# Patient Record
Sex: Male | Born: 2000 | Race: White | Hispanic: No | Marital: Single | Smoking: Never smoker
Health system: Southern US, Community
[De-identification: ages and names within clinical notes are randomized; demographics above are authoritative.]

## PROBLEM LIST (undated history)

## (undated) HISTORY — PX: NO PAST SURGERIES: SHX2092

---
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
## 2020-03-15 ENCOUNTER — Emergency Department
Admission: EM | Admit: 2020-03-15 | Discharge: 2020-03-15 | Disposition: A | Discharge status: Home / Self Care | Payer: PRIVATE HEALTH INSURANCE | Attending: Emergency Medicine | Admitting: Emergency Medicine

## 2020-03-15 ENCOUNTER — Emergency Department: Payer: PRIVATE HEALTH INSURANCE

## 2020-03-15 ENCOUNTER — Other Ambulatory Visit: Payer: Self-pay

## 2020-03-15 DIAGNOSIS — R109 Unspecified abdominal pain: Secondary | ICD-10-CM | POA: Insufficient documentation

## 2020-03-15 DIAGNOSIS — N200 Calculus of kidney: Secondary | ICD-10-CM | POA: Insufficient documentation

## 2020-03-15 DIAGNOSIS — R1110 Vomiting, unspecified: Secondary | ICD-10-CM | POA: Diagnosis not present

## 2020-03-15 LAB — COMPREHENSIVE METABOLIC PANEL
ALT: 17 U/L (ref 0–44)
AST: 22 U/L (ref 15–41)
Albumin: 4.9 g/dL (ref 3.5–5.0)
Alkaline Phosphatase: 67 U/L (ref 38–126)
Anion gap: 14 (ref 5–15)
BUN: 14 mg/dL (ref 6–20)
CO2: 22 mmol/L (ref 22–32)
Calcium: 9.5 mg/dL (ref 8.9–10.3)
Chloride: 105 mmol/L (ref 98–111)
Creatinine, Ser: 1.07 mg/dL (ref 0.61–1.24)
GFR calc Af Amer: >60 mL/min (ref >60)
GFR calc non Af Amer: >60 mL/min (ref >60)
Glucose, Bld: 128 mg/dL — ABNORMAL HIGH (ref 70–99)
Potassium: 3.5 mmol/L (ref 3.5–5.1)
Sodium: 141 mmol/L (ref 135–145)
Total Bilirubin: 1.2 mg/dL (ref 0.3–1.2)
Total Protein: 7.4 g/dL (ref 6.5–8.1)

## 2020-03-15 LAB — URINALYSIS, COMPLETE (UACMP) WITH MICROSCOPIC
Bacteria, UA: NONE SEEN
Bilirubin Urine: NEGATIVE
Glucose, UA: NEGATIVE mg/dL
Ketones, ur: 20 mg/dL — AB
Leukocytes,Ua: NEGATIVE
Nitrite: NEGATIVE
Protein, ur: 30 mg/dL — AB
Specific Gravity, Urine: 1.025 (ref 1.005–1.030)
Squamous Epithelial / HPF: NONE SEEN (ref 0–5)
pH: 5 (ref 5.0–8.0)

## 2020-03-15 LAB — CBC
HCT: 46 % (ref 39.0–52.0)
Hemoglobin: 16.1 g/dL (ref 13.0–17.0)
MCH: 31.1 pg (ref 26.0–34.0)
MCHC: 35 g/dL (ref 30.0–36.0)
MCV: 88.8 fL (ref 80.0–100.0)
Platelets: 278 10*3/uL (ref 150–400)
RBC: 5.18 MIL/uL (ref 4.22–5.81)
RDW: 12.6 % (ref 11.5–15.5)
WBC: 10.6 10*3/uL — ABNORMAL HIGH (ref 4.0–10.5)
nRBC: 0 % (ref 0.0–0.2)

## 2020-03-15 LAB — LIPASE, BLOOD: Lipase: 21 U/L (ref 11–51)

## 2020-03-15 MED ORDER — CEPHALEXIN 500 MG PO CAPS
500.0000 mg | ORAL_CAPSULE | Freq: Two times a day (BID) | ORAL | 0 refills | Status: AC
Start: 2020-03-15 — End: 2020-03-22

## 2020-03-15 MED ORDER — FENTANYL CITRATE (PF) 100 MCG/2ML IJ SOLN
50.0000 ug | Freq: Once | INTRAMUSCULAR | Status: AC
  Administered 2020-03-15: 50 ug via INTRAVENOUS
  Filled 2020-03-15: qty 2

## 2020-03-15 MED ORDER — CEPHALEXIN 500 MG PO CAPS
500.0000 mg | ORAL_CAPSULE | Freq: Once | ORAL | Status: AC
  Administered 2020-03-15: 500 mg via ORAL
  Filled 2020-03-15: qty 1

## 2020-03-15 MED ORDER — OXYCODONE-ACETAMINOPHEN 5-325 MG PO TABS
1.0000 | ORAL_TABLET | ORAL | Status: AC | PRN
  Administered 2020-03-15 (×2): 1 via ORAL
  Filled 2020-03-15 (×2): qty 1

## 2020-03-15 MED ORDER — ONDANSETRON HCL 4 MG/2ML IJ SOLN
4.0000 mg | Freq: Once | INTRAMUSCULAR | Status: AC
  Administered 2020-03-15: 4 mg via INTRAVENOUS
  Filled 2020-03-15: qty 2

## 2020-03-15 MED ORDER — OXYCODONE-ACETAMINOPHEN 5-325 MG PO TABS: 1.0000 | ORAL_TABLET | ORAL | 0 refills | Status: AC | PRN

## 2020-03-15 MED ORDER — ONDANSETRON 4 MG PO TBDP
4.0000 mg | ORAL_TABLET | Freq: Once | ORAL | Status: AC | PRN
  Administered 2020-03-15: 4 mg via ORAL
  Filled 2020-03-15: qty 1

## 2020-03-15 MED ORDER — ONDANSETRON 4 MG PO TBDP: 4.0000 mg | ORAL_TABLET | Freq: Three times a day (TID) | ORAL | 0 refills | Status: AC | PRN

## 2020-03-15 MED ORDER — KETOROLAC TROMETHAMINE 30 MG/ML IJ SOLN
15.0000 mg | Freq: Once | INTRAMUSCULAR | Status: AC
  Administered 2020-03-15: 15 mg via INTRAVENOUS
  Filled 2020-03-15: qty 1

## 2020-03-15 NOTE — ED Provider Notes (Signed)
[facility] Emergency Department Provider Note   ____________________________________________   First MD Initiated Contact with Patient 03/15/20 1505     (approximate)  I have reviewed the triage vital signs and the nursing notes.   HISTORY  Chief Complaint Flank Pain and Emesis    HPI Zachary Lynn is a 19 y.o. male with no significant past medical history who presents to the ED complaining of flank and abdominal pain.  Patient reports that he woke up this morning with severe pain radiating from his left flank into the left lower quadrant of his abdomen.  Pain has been waxing and waning in severity, but has been present constantly.  He describes it as sharp and not exacerbated or alleviated by anything.  It is been associated with nausea and he vomited once prior to arrival.  He denies any dysuria or hematuria, has not had any fevers.  He has no history of kidney stones or abdominal surgeries.        History reviewed. No pertinent past medical history.  There are no problems to display for this patient.   History reviewed. No pertinent surgical history.  Prior to Admission medications   Medication Sig Start Date End Date Taking? Authorizing Provider  cephALEXin (KEFLEX) 500 MG capsule Take 1 capsule (500 mg total) by mouth 2 (two) times daily for 7 days. 03/15/20 03/22/20  Chesley Noon, MD  ondansetron (ZOFRAN ODT) 4 MG disintegrating tablet Take 1 tablet (4 mg total) by mouth every 8 (eight) hours as needed for nausea or vomiting. 03/15/20   Chesley Noon, MD  oxyCODONE-acetaminophen (PERCOCET) 5-325 MG tablet Take 1 tablet by mouth every 4 (four) hours as needed for severe pain. 03/15/20 03/15/21  Chesley Noon, MD    Allergies Patient has no known allergies.  History reviewed. No pertinent family history.  Social History Social History   Tobacco Use  . Smoking status: Not on file  Substance Use Topics  . Alcohol use: Not on file  .  Drug use: Not on file    Review of Systems  Constitutional: No fever/chills Eyes: No visual changes. ENT: No sore throat. Cardiovascular: Denies chest pain. Respiratory: Denies shortness of breath. Gastrointestinal: Positive for flank and abdominal pain.  Positive for nausea and vomiting.  No diarrhea.  No constipation. Genitourinary: Negative for dysuria. Musculoskeletal: Negative for back pain. Skin: Negative for rash. Neurological: Negative for headaches, focal weakness or numbness.  ____________________________________________   PHYSICAL EXAM:  VITAL SIGNS: ED Triage Vitals [03/15/20 1148]  Enc Vitals Group     BP (!) 142/56     Pulse Rate 90     Resp 18     Temp 98.4 F (36.9 C)     Temp Source Oral     SpO2 99 %     Weight 125 lb (56.7 kg)     Height 5\' 7"  (1.702 m)     Head Circumference      Peak Flow      Pain Score 10     Pain Loc      Pain Edu?      Excl. in GC?     Constitutional: Alert and oriented. Eyes: Conjunctivae are normal. Head: Atraumatic. Nose: No congestion/rhinnorhea. Mouth/Throat: Mucous membranes are moist. Neck: Normal ROM Cardiovascular: Normal rate, regular rhythm. Grossly normal heart sounds. Respiratory: Normal respiratory effort.  No retractions. Lungs CTAB. Gastrointestinal: Soft and mildly tender to palpation in the left lower quadrant with no rebound or guarding.  No  CVA tenderness bilaterally. No distention. Genitourinary: deferred Musculoskeletal: No lower extremity tenderness nor edema. Neurologic:  Normal speech and language. No gross focal neurologic deficits are appreciated. Skin:  Skin is warm, dry and intact. No rash noted. Psychiatric: Mood and affect are normal. Speech and behavior are normal.  ____________________________________________   LABS (all labs ordered are listed, but only abnormal results are displayed)  Labs Reviewed  COMPREHENSIVE METABOLIC PANEL - Abnormal; Notable for the following components:        Result Value   Glucose, Bld 128 (*)    All other components within normal limits  CBC - Abnormal; Notable for the following components:   WBC 10.6 (*)    All other components within normal limits  URINALYSIS, COMPLETE (UACMP) WITH MICROSCOPIC - Abnormal; Notable for the following components:   Color, Urine YELLOW (*)    APPearance CLOUDY (*)    Hgb urine dipstick SMALL (*)    Ketones, ur 20 (*)    Protein, ur 30 (*)    All other components within normal limits  URINE CULTURE  LIPASE, BLOOD    PROCEDURES  Procedure(s) performed (including Critical Care):  Procedures   ____________________________________________   INITIAL IMPRESSION / ASSESSMENT AND PLAN / ED COURSE       19 year old male with no significant past medical history presents to the ED complaining of severe left flank pain radiating to the left lower quadrant of his abdomen starting earlier this morning along with nausea and one episode of vomiting.  He has some mild left lower quadrant tenderness but no CVA tenderness.  CT scan performed from triage demonstrates distal left ureteral stone with obstruction and associated hydronephrosis.  There is questionable infection on UA and we will send urine for culture, treat with Keflex for now.  His symptoms are slightly improved following dose of Percocet but we will also treat with IV Toradol and reassess.  Remainder of lab work is unremarkable and if we are able to control his pain here in the ED he would be appropriate for discharge home with urology follow-up.  Patient reports pain is much improved and he is tolerating p.o. without difficulty.  He is appropriate for discharge home with urology follow-up, was counseled to return to the ED for any new or worsening symptoms.  Patient agrees with plan.      ____________________________________________   FINAL CLINICAL IMPRESSION(S) / ED DIAGNOSES  Final diagnoses:  Kidney stone     ED Discharge Orders          Ordered    oxyCODONE-acetaminophen (PERCOCET) 5-325 MG tablet  Every 4 hours PRN     Discontinue  Reprint     03/15/20 1630    ondansetron (ZOFRAN ODT) 4 MG disintegrating tablet  Every 8 hours PRN     Discontinue  Reprint     03/15/20 1630    cephALEXin (KEFLEX) 500 MG capsule  2 times daily     Discontinue  Reprint     03/15/20 1630           Note:  This document was prepared using Dragon voice recognition software and may include unintentional dictation errors.   Blake Divine, MD 03/15/20 [phone]

## 2020-03-15 NOTE — ED Notes (Signed)
Pt provided urine strainer and specimen cup and instructed on use.

## 2020-03-15 NOTE — ED Triage Notes (Signed)
Pt comes POV with n/v and left lower abdominal and flank pain starting this morning when pt woke up. Pt has not had a stone before. Denies trouble urinating or burning.

## 2020-03-15 NOTE — ED Notes (Signed)
Pt states pain has not decreased by much

## 2020-03-15 NOTE — ED Notes (Signed)
Pt c/o left sided flank pain, states nausea has subsided. Pt AOx4, appears uncomfortable

## 2020-03-17 LAB — URINE CULTURE

## 2021-05-05 ENCOUNTER — Other Ambulatory Visit: Payer: Self-pay | Admitting: Podiatry

## 2021-05-05 DIAGNOSIS — R600 Localized edema: Secondary | ICD-10-CM

## 2021-05-12 ENCOUNTER — Ambulatory Visit
Admission: RE | Admit: 2021-05-12 | Discharge: 2021-05-12 | Disposition: A | Discharge status: Home / Self Care | Payer: PRIVATE HEALTH INSURANCE | Source: Ambulatory Visit | Attending: Podiatry | Admitting: Podiatry

## 2021-05-12 ENCOUNTER — Other Ambulatory Visit: Payer: Self-pay

## 2021-05-12 DIAGNOSIS — R600 Localized edema: Secondary | ICD-10-CM | POA: Diagnosis not present

## 2021-05-12 MED ORDER — GADOBUTROL 1 MMOL/ML IV SOLN
5.0000 mL | Freq: Once | INTRAVENOUS | Status: AC | PRN
  Administered 2021-05-12: 5 mL via INTRAVENOUS

## 2021-06-09 ENCOUNTER — Other Ambulatory Visit: Payer: Self-pay

## 2021-06-09 ENCOUNTER — Ambulatory Visit (INDEPENDENT_AMBULATORY_CARE_PROVIDER_SITE_OTHER): Payer: No Typology Code available for payment source | Admitting: Nurse Practitioner

## 2021-06-09 ENCOUNTER — Encounter (INDEPENDENT_AMBULATORY_CARE_PROVIDER_SITE_OTHER): Payer: Self-pay | Admitting: Nurse Practitioner

## 2021-06-09 ENCOUNTER — Other Ambulatory Visit (INDEPENDENT_AMBULATORY_CARE_PROVIDER_SITE_OTHER): Payer: Self-pay | Admitting: Nurse Practitioner

## 2021-06-09 ENCOUNTER — Ambulatory Visit (INDEPENDENT_AMBULATORY_CARE_PROVIDER_SITE_OTHER): Payer: No Typology Code available for payment source

## 2021-06-09 VITALS — BP 128/68 | HR 64 | Resp 16 | Ht 70.0 in | Wt 147.0 lb

## 2021-06-09 DIAGNOSIS — M7989 Other specified soft tissue disorders: Secondary | ICD-10-CM

## 2021-06-09 DIAGNOSIS — R2242 Localized swelling, mass and lump, left lower limb: Secondary | ICD-10-CM | POA: Diagnosis not present

## 2021-06-09 NOTE — Progress Notes (Signed)
Subjective:    Patient ID: Zachary Lynn, male    DOB: 19-May-2001, 20 y.o.   MRN: [id number] Chief Complaint  Patient presents with  . New Patient (Initial Visit)    Ref Ether Griffins left foot swelling    Zachary Lynn is a 20 year old male that presents today for evaluation of left foot swelling by his podiatrist Dr. Ether Griffins.  This is been ongoing for about 8 months and it happened out of the blue.  He notes that it is not painful although sometimes he gets some soreness in his thigh.  The swelling never goes further than his ankle area.  He does note that he wears compression socks daily although he is not sure of the tightness daily and he also elevates his lower extremity.  He also describes himself as being fairly active.  The patient recently had an MRI which showed soft tissue edema.  The patient denies any claudication-like symptoms, rest pain or ulceration.  Today noninvasive studies show no evidence of DVT or superficial thrombophlebitis in the left lower extremity.  No evidence of deep venous insufficiency in the left lower extremity as well as no evidence of superficial venous reflux in the great or short saphenous vein.   Review of Systems  Cardiovascular:        Foot swelling  All other systems reviewed and are negative.     Objective:   Physical Exam Vitals reviewed.  HENT:     Head: Normocephalic.  Cardiovascular:     Rate and Rhythm: Normal rate.     Pulses: Normal pulses.          Dorsalis pedis pulses are 2+ on the left side.       Posterior tibial pulses are 2+ on the left side.  Pulmonary:     Effort: Pulmonary effort is normal.  Skin:    General: Skin is warm and dry.     Capillary Refill: Capillary refill takes less than 2 seconds.  Neurological:     Mental Status: He is alert and oriented to person, place, and time.  Psychiatric:        Mood and Affect: Mood normal.        Behavior: Behavior normal.        Thought Content: Thought content normal.         Judgment: Judgment normal.    BP 128/68 (BP Location: Right Arm)   Pulse 64   Resp 16   Ht 5\' 10"  (1.778 m)   Wt 147 lb (66.7 kg)   BMI 21.09 kg/m   History reviewed. No pertinent past medical history.  Social History   Socioeconomic History  . Marital status: Single    Spouse name: Not on file  . Number of children: Not on file  . Years of education: Not on file  . Highest education level: Not on file  Occupational History  . Not on file  Tobacco Use  . Smoking status: Never  . Smokeless tobacco: Never  Substance and Sexual Activity  . Alcohol use: Never  . Drug use: Never  . Sexual activity: Not on file  Other Topics Concern  . Not on file  Social History Narrative  . Not on file   Social Determinants of Health   Financial Resource Strain: Not on file  Food Insecurity: Not on file  Transportation Needs: Not on file  Physical Activity: Not on file  Stress: Not on file  Social Connections: Not on file  Intimate Partner Violence: Not on file    Past Surgical History:  Procedure Laterality Date  . NO PAST SURGERIES      Family History  Problem Relation Age of Onset  . Stroke Father   . Heart disease Maternal Grandfather   . Heart attack Maternal Grandfather     No Known Allergies  CBC Latest Ref Rng & Units 03/15/2020  WBC 4.0 - 10.5 K/uL 10.6(H)  Hemoglobin 13.0 - 17.0 g/dL 64.3  Hematocrit 32.9 - 52.0 % 46.0  Platelets 150 - 400 K/uL 278      CMP     Component Value Date/Time   NA 141 03/15/2020 1154   K 3.5 03/15/2020 1154   CL 105 03/15/2020 1154   CO2 22 03/15/2020 1154   GLUCOSE 128 (H) 03/15/2020 1154   BUN 14 03/15/2020 1154   CREATININE 1.07 03/15/2020 1154   CALCIUM 9.5 03/15/2020 1154   PROT 7.4 03/15/2020 1154   ALBUMIN 4.9 03/15/2020 1154   AST 22 03/15/2020 1154   ALT 17 03/15/2020 1154   ALKPHOS 67 03/15/2020 1154   BILITOT 1.2 03/15/2020 1154   GFRNONAA >60 03/15/2020 1154   GFRAA >60 03/15/2020 1154     No  results found.     Assessment & Plan:   1. Localized swelling of left foot Today noninvasive studies show no evidence of vascular abnormalities.  The patient has no evidence of venous insufficiency and lymphedema is unlikely as it typically is not present in just the foot area.  The patient is advised to continue with use of his medical grade compression stockings 20 to 30 mmHg.  He is also advised to elevate his lower extremities when he is not active.  Otherwise the patient will continue to follow-up with his podiatrist as well as PCP for further work-up of possible cause of the foot swelling.   Current Outpatient Medications on File Prior to Visit  Medication Sig Dispense Refill  . ondansetron (ZOFRAN ODT) 4 MG disintegrating tablet Take 1 tablet (4 mg total) by mouth every 8 (eight) hours as needed for nausea or vomiting. (Patient not taking: Reported on 06/09/2021) 12 tablet 0   No current facility-administered medications on file prior to visit.    There are no Patient Instructions on file for this visit. No follow-ups on file.   Georgiana Spinner, NP

## 2022-08-02 IMAGING — MR MR FOOT*L* WO/W CM
12 series · 40 of 40 positions shown · IV contrast (gadavist)
Comparison: Radiograph report from 04/28/2021. Images not
retrievable at the time of this dictation.

CLINICAL DATA: Left foot swelling for 6-7 months

EXAM:
MRI OF THE LEFT FOREFOOT WITHOUT AND WITH CONTRAST
TECHNIQUE: Multiplanar, multisequence MR imaging of the left foot was performed
both before and after administration of intravenous contrast.
CONTRAST:  5mL GADAVIST GADOBUTROL 1 MMOL/ML IV SOLN

[Series 4: T1 · coronal · 3.0mm · 0.66mm/px · 4 of 45 slices shown (1 of 3)]
[im 1/45]
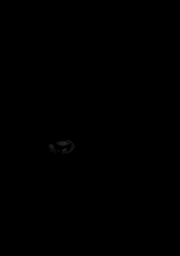
[im 15/45]
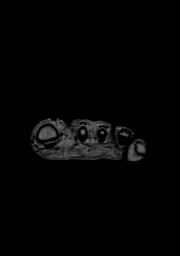
[im 30/45]
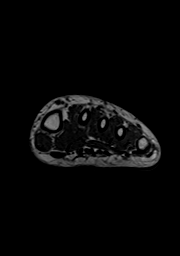
[im 45/45]
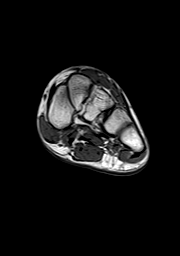

[Series 5: T1 fat-sat · coronal · 3.0mm · 0.66mm/px · 4 of 45 slices shown (1 of 2)]
[im 1/45]
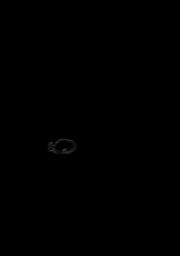
[im 15/45]
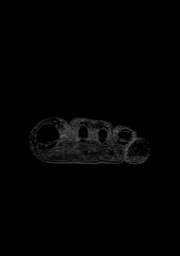
[im 30/45]
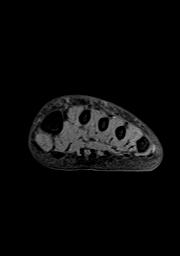
[im 45/45]
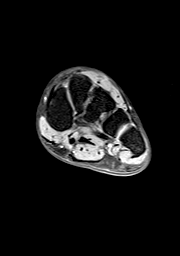

[Series 6: T2 fat-sat · coronal · 3.0mm · 0.66mm/px · 5 of 48 slices shown (1 of 3)]
[im 1/48]
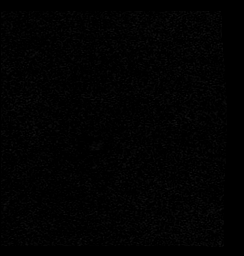
[im 12/48]
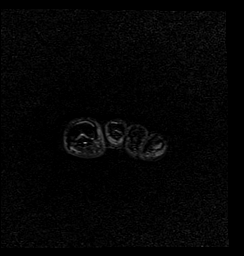
[im 24/48]
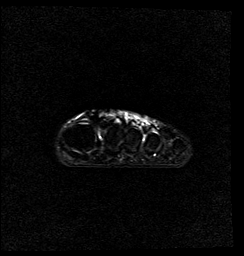
[im 36/48]
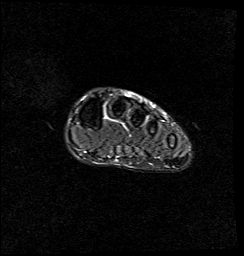
[im 48/48]
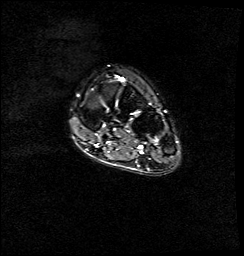

[Series 7: T1 · axial · 3.0mm · 0.66mm/px · 1 of 13 slices shown (2 of 3)]
[im 1/13]
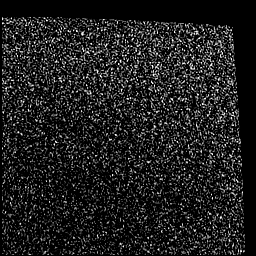

[Series 8: T1 · axial · 3.0mm · 0.66mm/px · z∈[-116,-36]mm · 3 of 25 slices shown (3 of 3)]
[im 1/25]
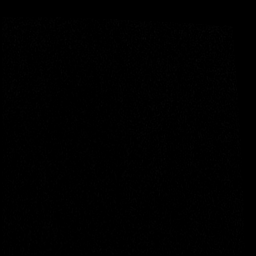
[im 13/25]
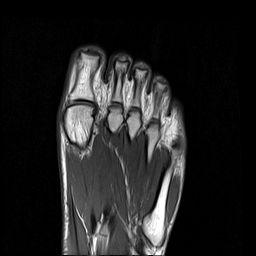
[im 25/25]
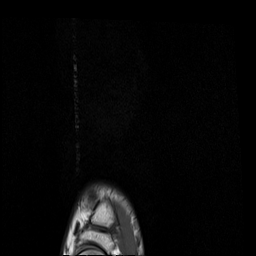

[Series 9: T2 fat-sat · axial · 3.0mm · 0.66mm/px · z∈[-121,-32]mm · 3 of 28 slices shown (2 of 3)]
[im 1/28]
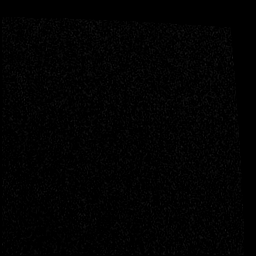
[im 14/28]
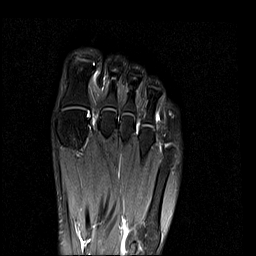
[im 28/28]
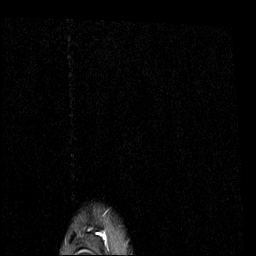

[Series 10: STIR · sagittal · 3.0mm · 0.70mm/px · 3 of 30 slices shown (1 of 2)]
[im 1/30]
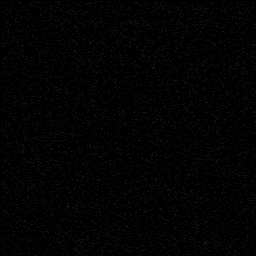
[im 15/30]
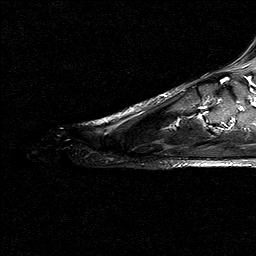
[im 30/30]
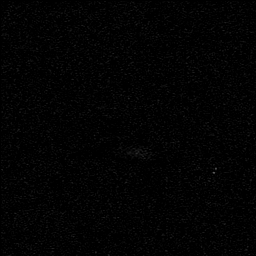

[Series 11: STIR · sagittal · 3.0mm · 0.59mm/px · 3 of 30 slices shown (2 of 2)]
[im 1/30]
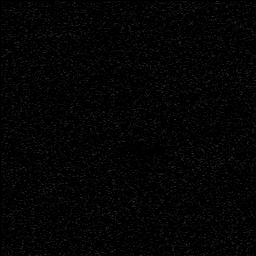
[im 15/30]
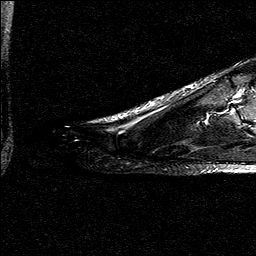
[im 30/30]
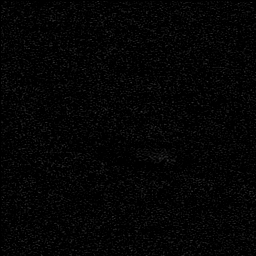

[Series 12: T1 fat-sat · coronal · 3.0mm · 0.66mm/px · 5 of 45 slices shown (2 of 2)]
[im 1/45]
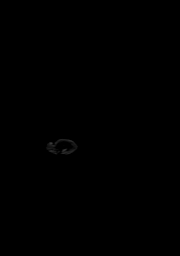
[im 12/45]
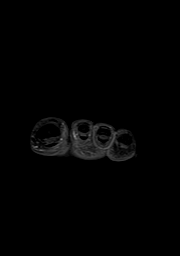
[im 23/45]
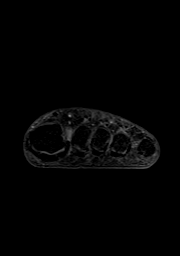
[im 34/45]
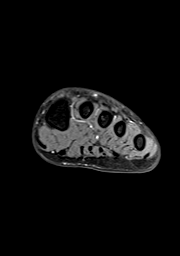
[im 45/45]
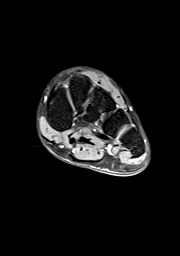

[Series 14: T2 fat-sat · sagittal · 3.0mm · 0.59mm/px · 3 of 30 slices shown (3 of 3)]
[im 1/30]
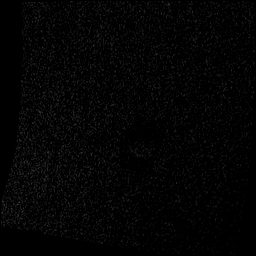
[im 15/30]
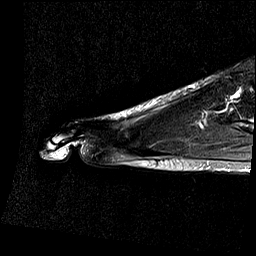
[im 30/30]
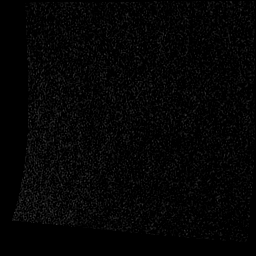

[Series 15: T1 fat-sat post-contrast · sagittal · 3.0mm · 0.66mm/px · 3 of 30 slices shown (1 of 2)]
[im 1/30]
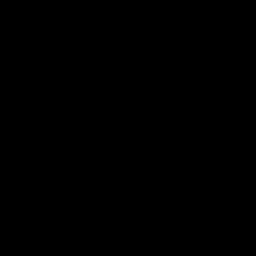
[im 15/30]
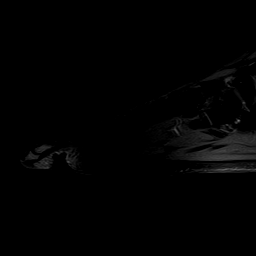
[im 30/30]
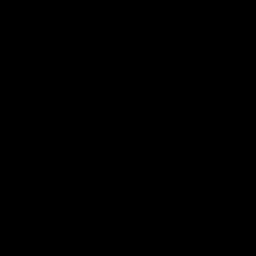

[Series 16: T1 fat-sat post-contrast · axial · 3.0mm · 0.66mm/px · z∈[-124,-28]mm · 3 of 30 slices shown (2 of 2)]
[im 1/30]
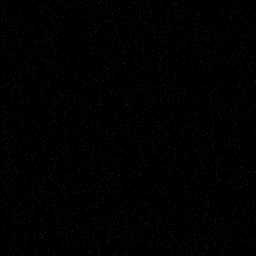
[im 15/30]
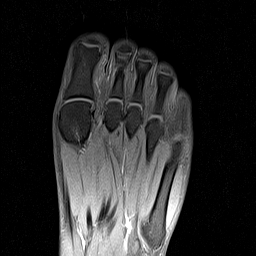
[im 30/30]
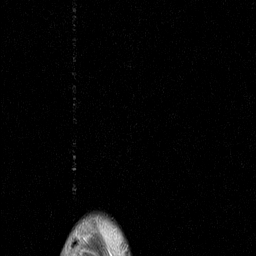

[40 of 40 positions shown; findings below may reference images not displayed]

FINDINGS: Bones/Joint/Cartilage

The cortex is intact. There is no significant marrow signal
alteration. There is no focal bone lesion.

Ligaments

Intact Lisfranc ligament. No evidence of plantar plate tear. The
collateral ligaments are intact.

Muscles and Tendons

No muscle atrophy.  No acute tendon tear.  No tenosynovitis.

Soft tissues

No focal fluid collection. No cystic or solid mass. No evidence of
intermetatarsal neuroma or bursitis. There is mild dorsal soft
tissue swelling superficially.
IMPRESSION: Mild dorsal superficial soft tissue swelling. Otherwise unremarkable
MRI of the left forefoot.
